# Patient Record
Sex: Male | Born: 1995 | Race: White | Hispanic: No | Marital: Single
Health system: Southern US, Community
[De-identification: ages and names within clinical notes are randomized; demographics above are authoritative.]

---
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
## 2014-09-20 IMAGING — CT CT HEAD W/O CM
2 of 6 series · 16 of 30 positions shown, 19 images · non-contrast
Comparison: None

CT HEAD

CLINICAL DATA: Fall with head injury.  Nausea vomiting.  Neck
pain.

CT HEAD WITHOUT CONTRAST
CT CERVICAL SPINE WITHOUT CONTRAST
TECHNIQUE: Multidetector CT imaging of the head and cervical spine
was performed following the standard protocol without intravenous
contrast.  Multiplanar CT image reconstructions of the cervical
spine were also generated.

[Series 11: head w/o · axial · non-contrast · 0.43mm/px · z∈[-114,+9]mm · 10 of 303 slices shown, 13 images]
[im 28/303  brain]
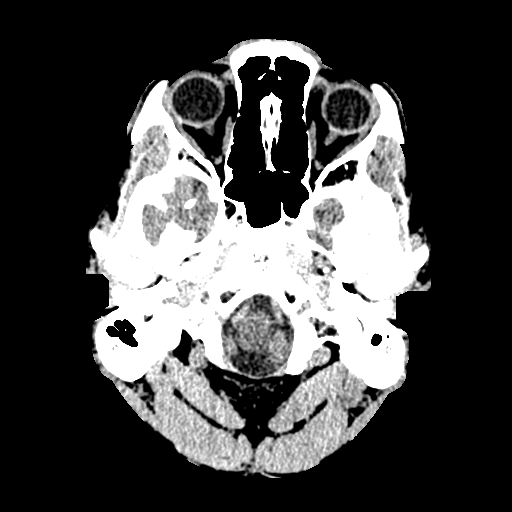
[im 28/303  bone]
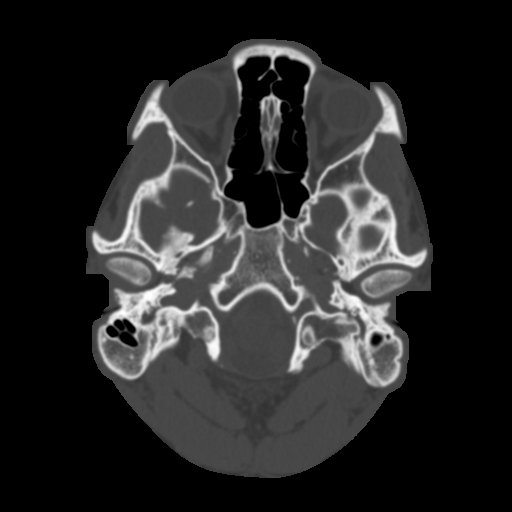
[im 55/303  brain]
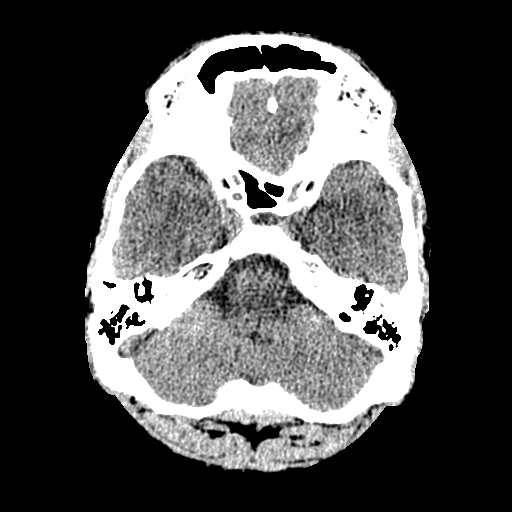
[im 83/303  brain]
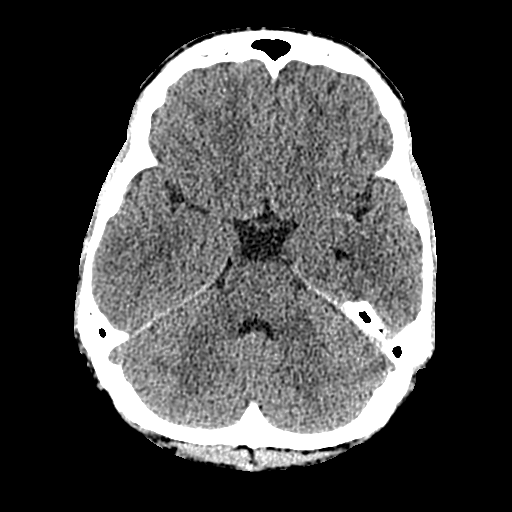
[im 110/303  brain]
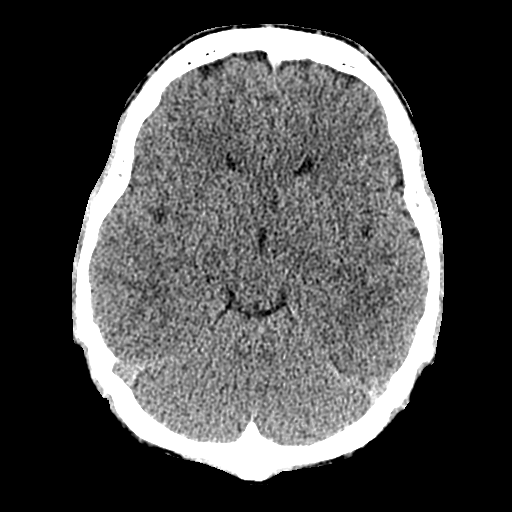
[im 138/303  brain]
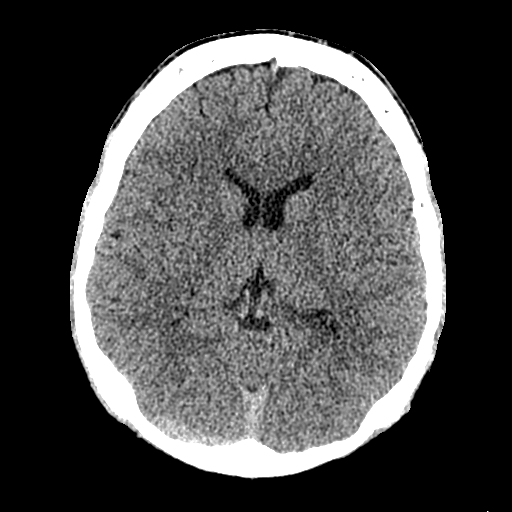
[im 138/303  bone]
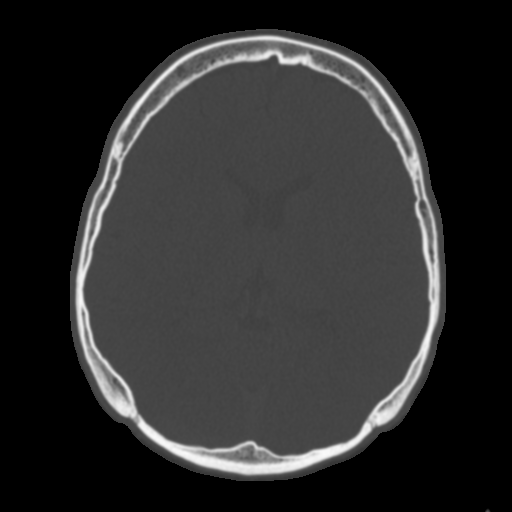
[im 165/303  brain]
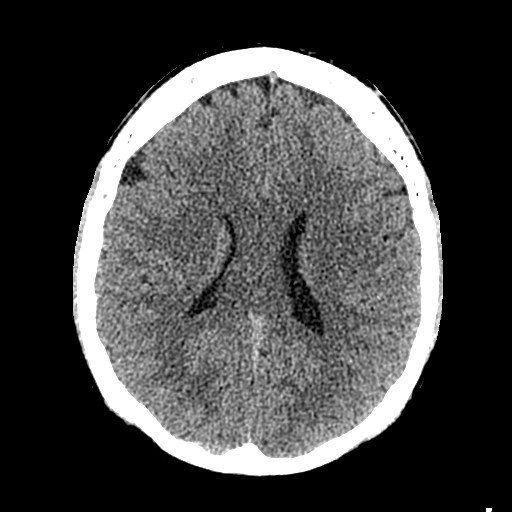
[im 193/303  brain]
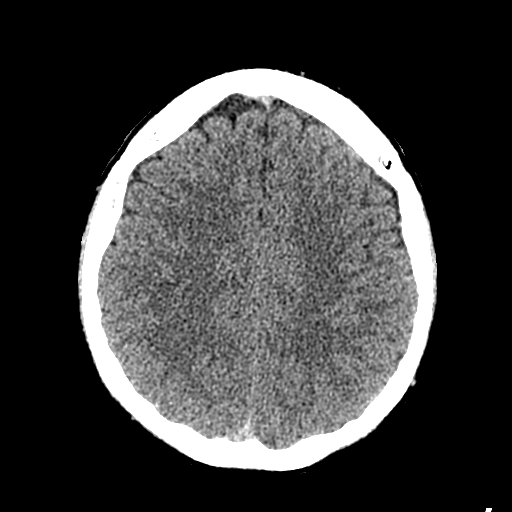
[im 220/303  brain]
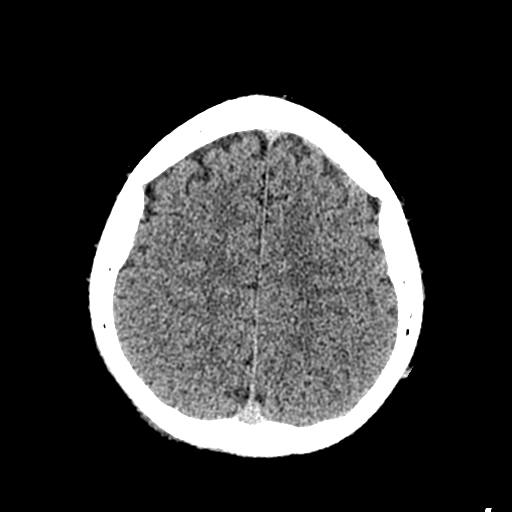
[im 248/303  brain]
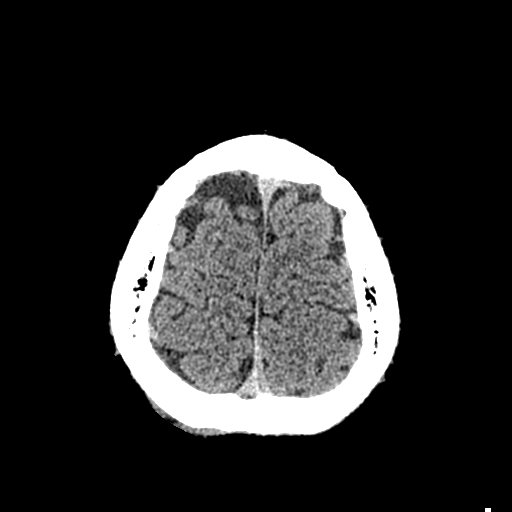
[im 248/303  bone]
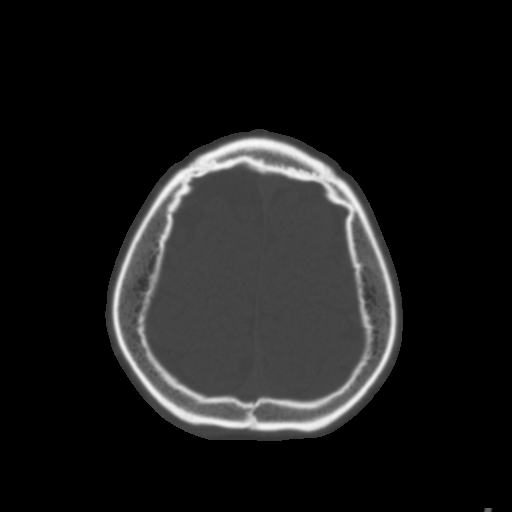
[im 275/303  brain]
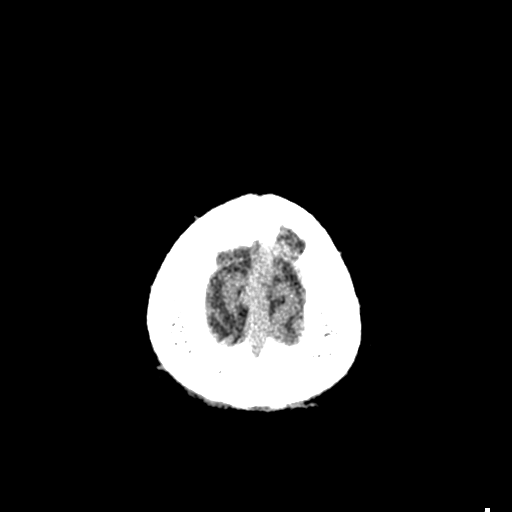

[Series 12: bone windows · axial · 0.43mm/px · z∈[-107,+1]mm · 6 of 216 slices shown]
[im 31/216  bone]
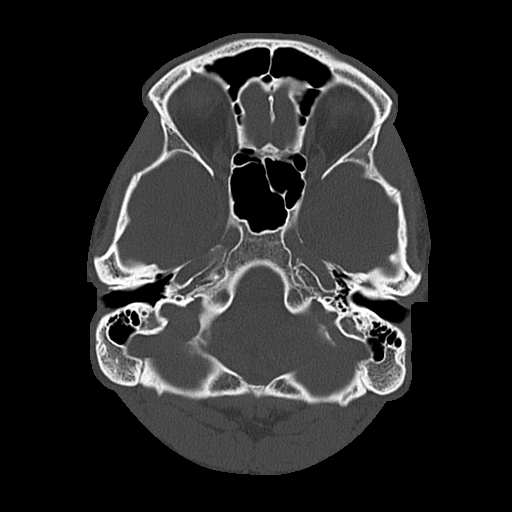
[im 62/216  bone]
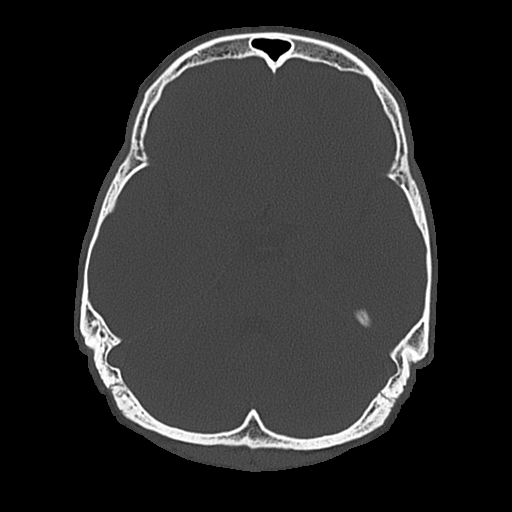
[im 93/216  bone]
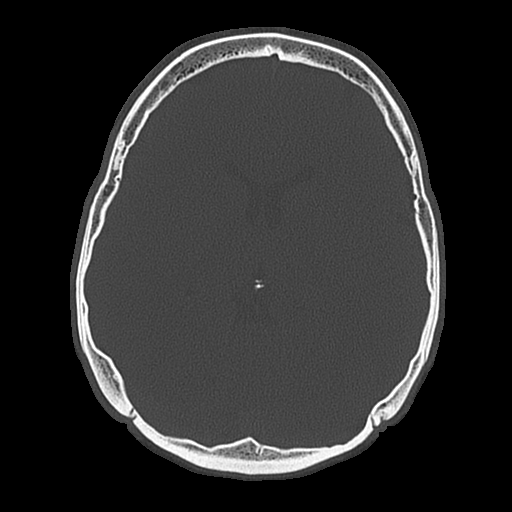
[im 123/216  bone]
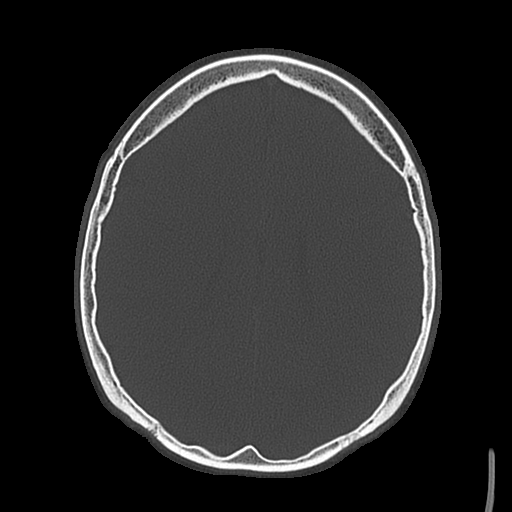
[im 154/216  bone]
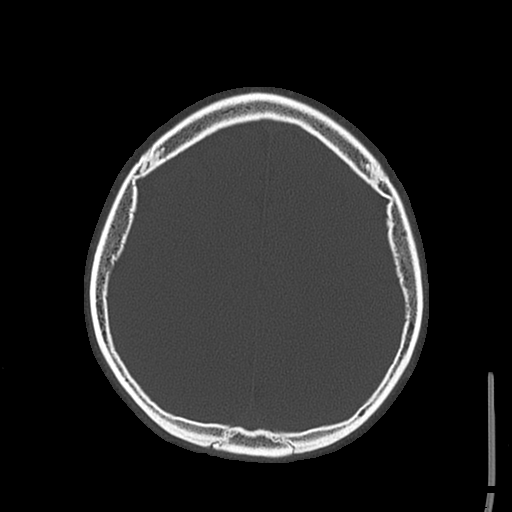
[im 185/216  bone]
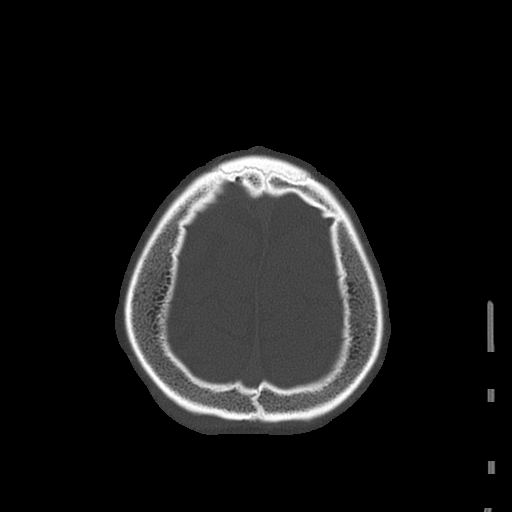

[16 of 30 positions shown; findings below may reference images not displayed]

FINDINGS: Skull:No acute osseous abnormality. No lytic or blastic lesion.

Orbits: No acute abnormality.

Brain: No evidence of acute abnormality, such as acute infarction,
hemorrhage, hydrocephalus, or mass lesion/mass effect.
IMPRESSION: Negative head CT.

CT CERVICAL SPINE
FINDINGS: Normal alignment.  No acute fracture.  Rudimentary C7
ribs.  No gross cervical canal hematoma.  No prevertebral edema.
No degenerative changes.
IMPRESSION: No evidence of cervical spine injury.
